# Patient Record
Sex: Male | Born: 1965 | Race: Black or African American | Hispanic: No | Marital: Single | Smoking: Former smoker
Health system: Southern US, Community
[De-identification: ages and names within clinical notes are randomized; demographics above are authoritative.]

## PROBLEM LIST (undated history)

## (undated) DIAGNOSIS — R011 Cardiac murmur, unspecified: Secondary | ICD-10-CM

## (undated) HISTORY — PX: HERNIA REPAIR: SHX51

## (undated) HISTORY — DX: Cardiac murmur, unspecified: R01.1

---

## 2008-08-16 ENCOUNTER — Emergency Department: Payer: Self-pay | Admitting: Emergency Medicine

## 2011-09-27 ENCOUNTER — Emergency Department: Payer: Self-pay | Admitting: *Deleted

## 2011-10-10 ENCOUNTER — Ambulatory Visit: Payer: Self-pay | Admitting: Unknown Physician Specialty

## 2013-03-04 IMAGING — CT CT MAXILLOFACIAL WITHOUT CONTRAST
2 series · 15 of 40 positions shown, 18 images · non-contrast
Comparison: none

REASON FOR EXAM: trauma
COMMENTS:

[Series 2: facial 3.0 h60f · axial · 0.36mm/px · z∈[-193,-37]mm · 12 of 62 slices shown, 15 images]
[im 5/62  brain]
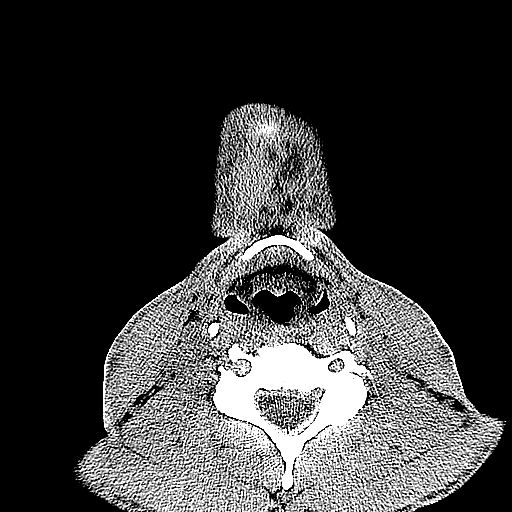
[im 5/62  bone]
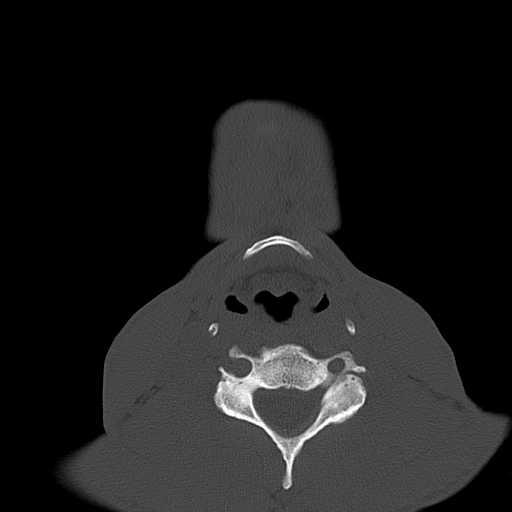
[im 9/62  bone]
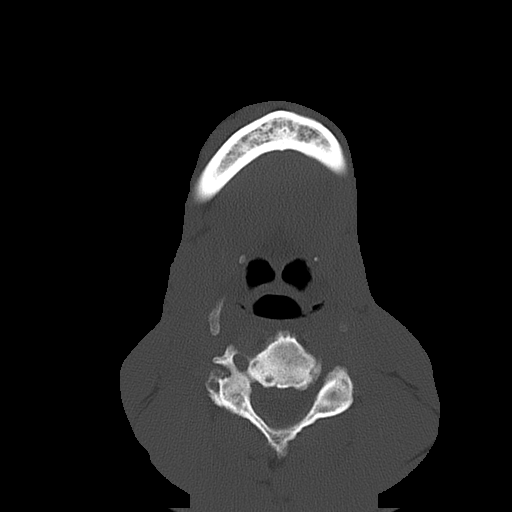
[im 13/62  bone]
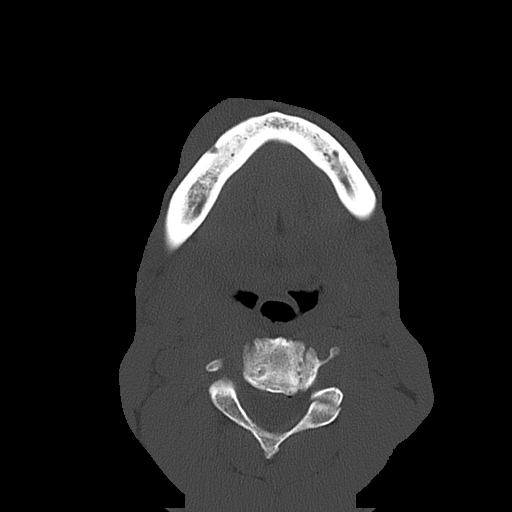
[im 17/62  bone]
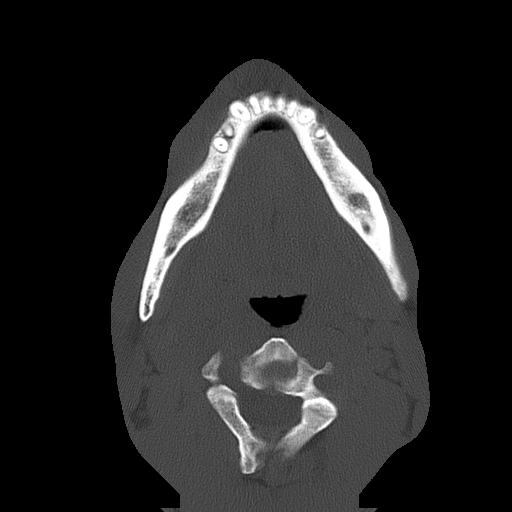
[im 26/62  brain]
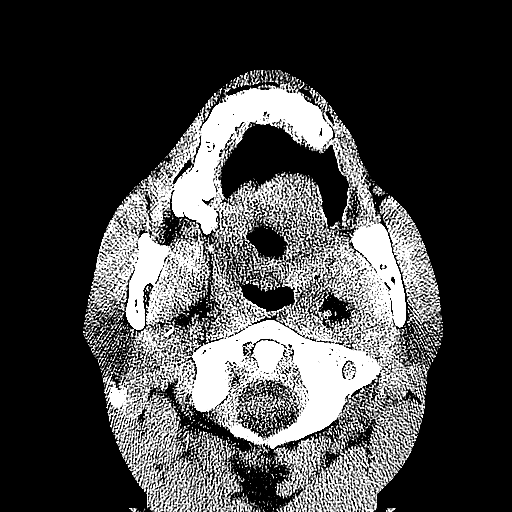
[im 26/62  bone]
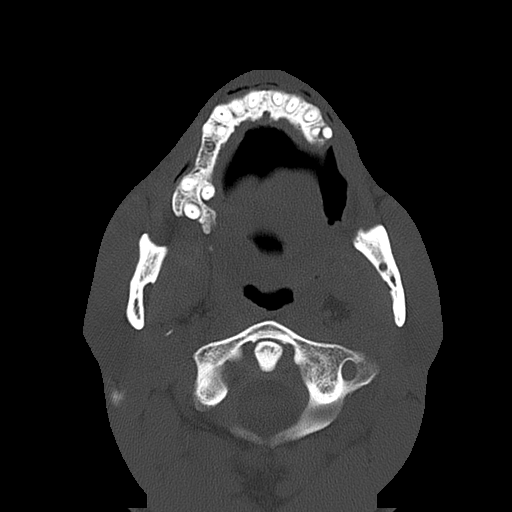
[im 30/62  bone]
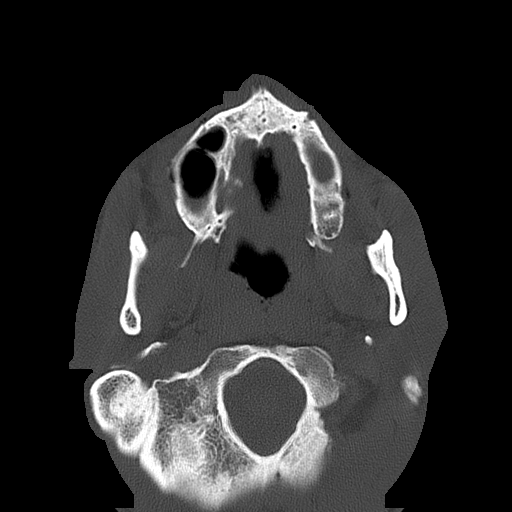
[im 34/62  bone]
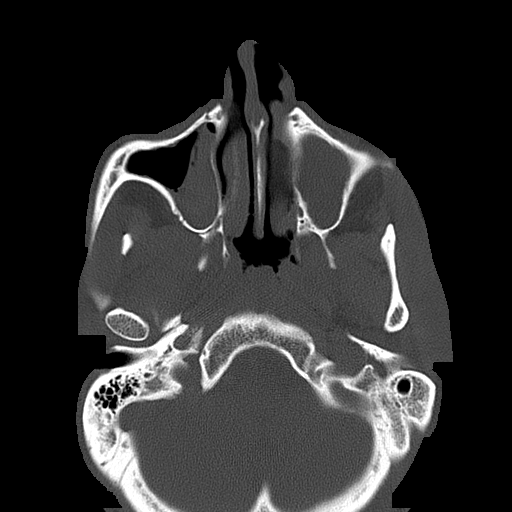
[im 38/62  bone]
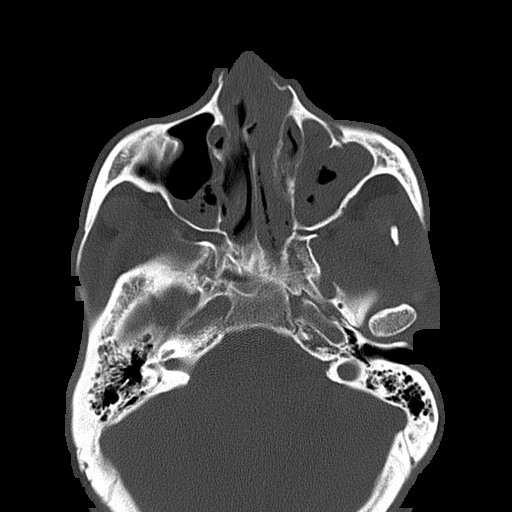
[im 45/62  brain]
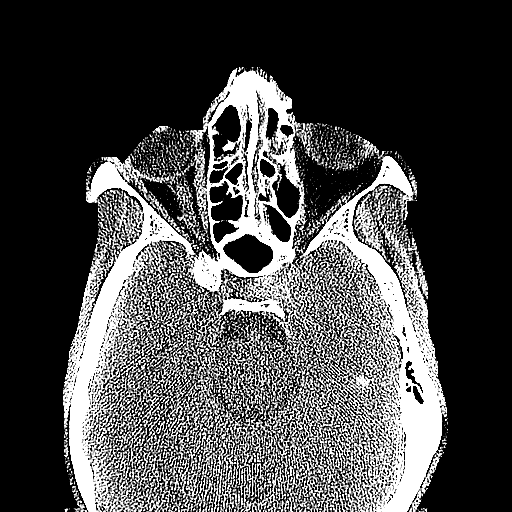
[im 45/62  bone]
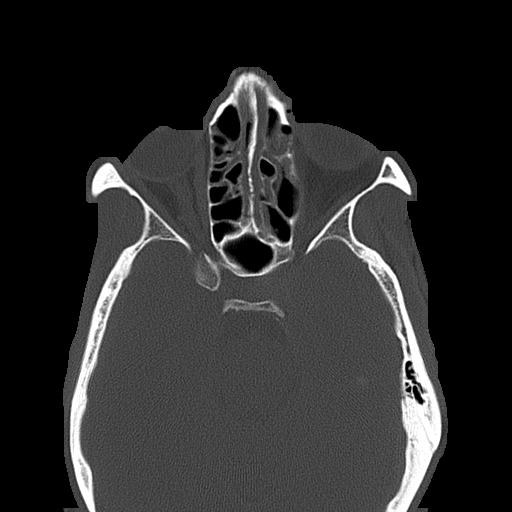
[im 49/62  bone]
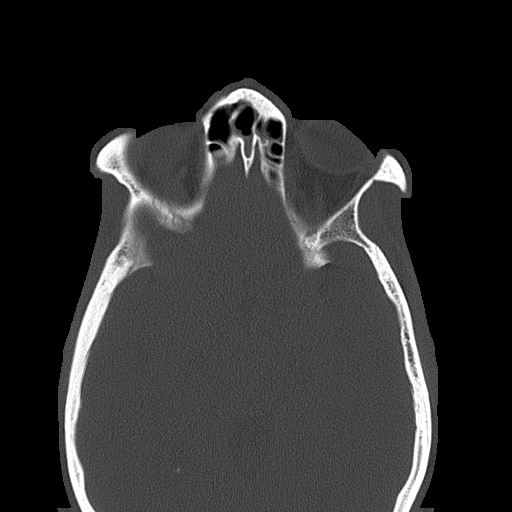
[im 53/62  bone]
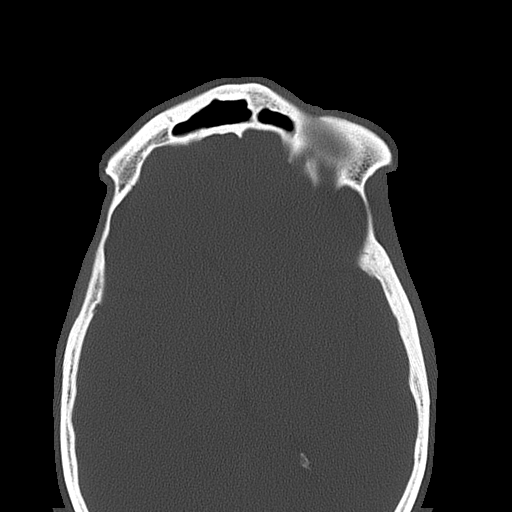
[im 57/62  bone]
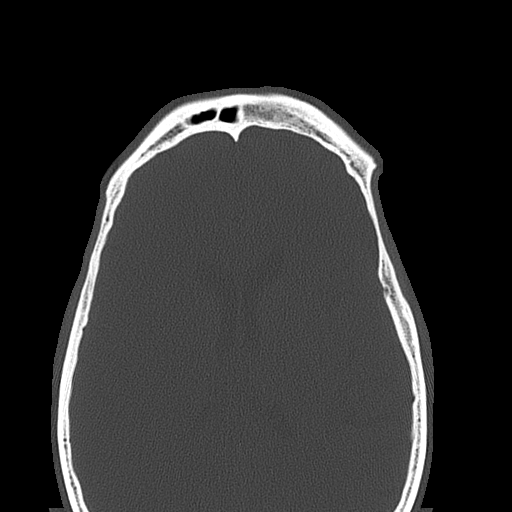

[Series 3: facial 3.0 spo · coronal · 0.38mm/px · 3 of 41 slices shown]
[im 14/41  bone]
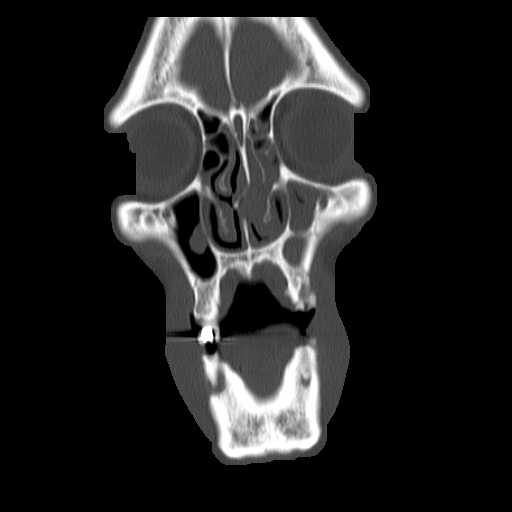
[im 18/41  bone]
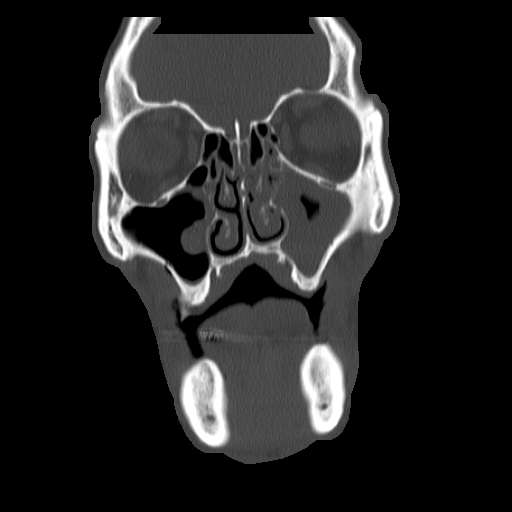
[im 23/41  bone]
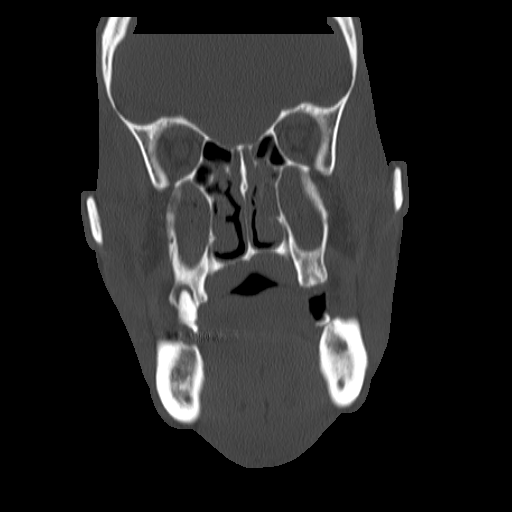

[15 of 40 positions shown; findings below may reference images not displayed]

PROCEDURE:     CT  - CT MAXILLOFACIAL AREA WO  - September 27, 2011  [DATE]

RESULT:     Multislice helical acquisition through the maxillofacial
structures is reconstructed in the coronal and axial planes at 3.0 mm slice
thickness. There is no previous exam for comparison.

Significant left periorbital and nasal soft tissue swelling is present.
Air-fluid level is seen in the right maxillary sinus with near complete
opacification of the left maxillary sinus and a small air-fluid level in the
right sphenoid sinus. The mastoid show normal aeration. The nasal septum
approximates midline without a definite fracture. Nasal bone fractures and
fracture through the nasal process of the frontal bone are noted. There is a
suggestion of a nondisplaced medial left orbital wall fracture in the region
of axial image 47 and coronal image 16 and coronal image 19. Nondisplaced
right maxillary sinus fracture is seen in the anterior and lateral walls.
Minimally displaced left lamina papyracea fracture is seen. Periodontal
disease changes are present. There is no evidence of mandibular fracture or
dislocation.
IMPRESSION: Right maxillary, left orbital and nasal bone fractures as described with
fracture of the left lamina papyracea and through the nasal process of the
frontal bones noted. Orbital structures are otherwise unremarkable. Sinus
opacification is present as described.

## 2013-03-04 IMAGING — CT CT HEAD WITHOUT CONTRAST
2 series · 16 of 30 positions shown, 20 images · non-contrast
Comparison: none

REASON FOR EXAM: trauma
COMMENTS:

[Series 2: without · axial · non-contrast · 0.44mm/px · z∈[-165,-30]mm · 13 of 33 slices shown, 17 images]
[im 3/33  brain]
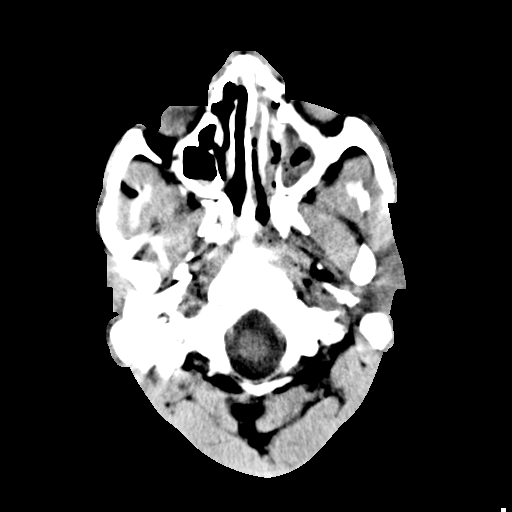
[im 3/33  bone]
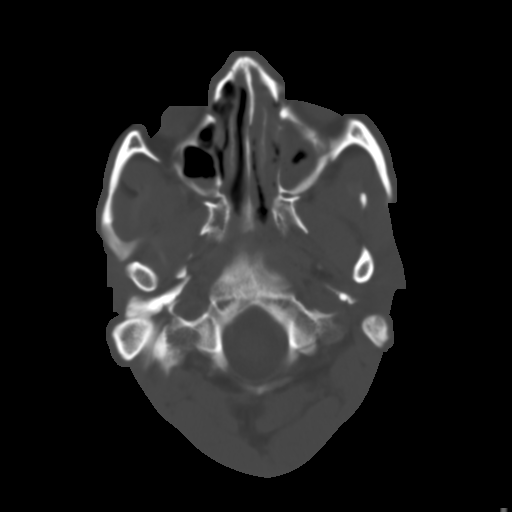
[im 5/33  brain]
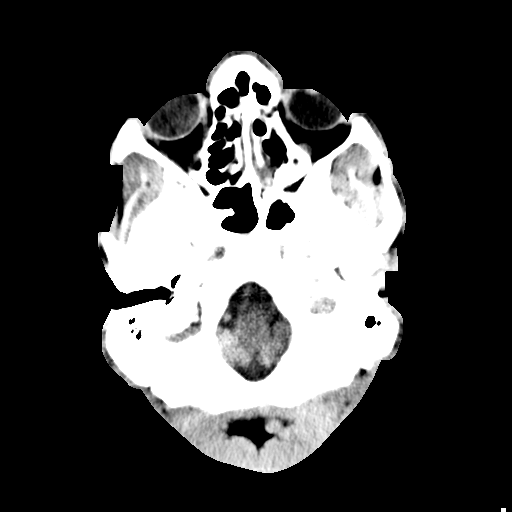
[im 7/33  brain]
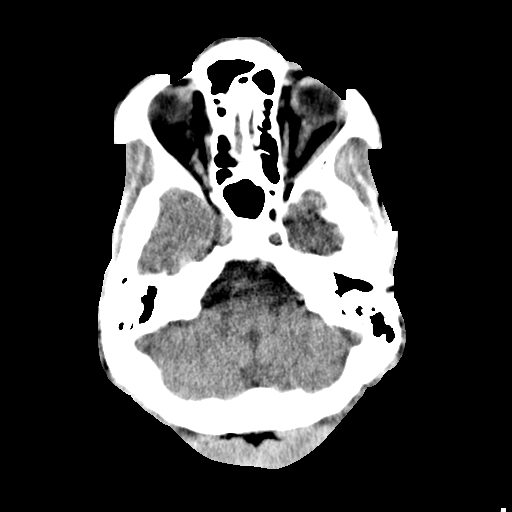
[im 10/33  brain]
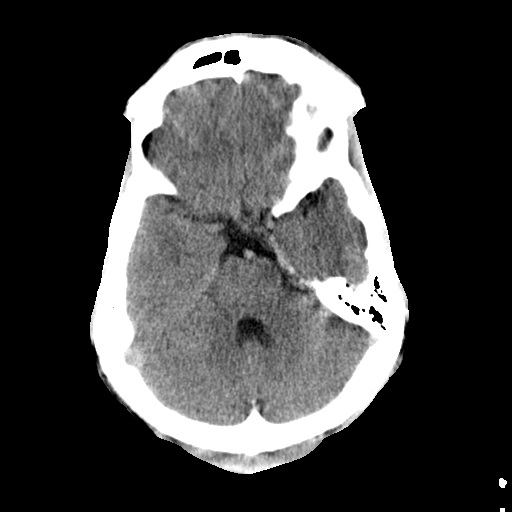
[im 12/33  brain]
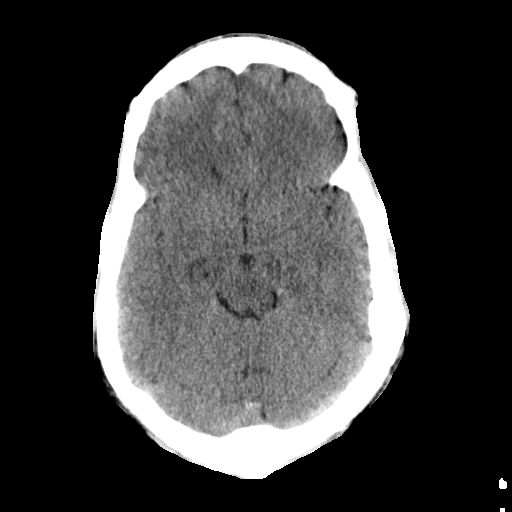
[im 12/33  bone]
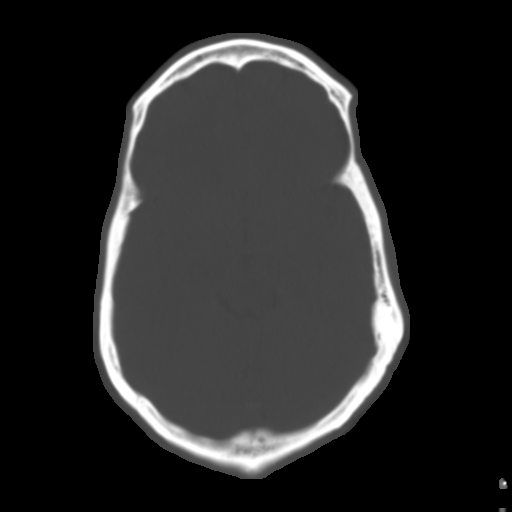
[im 14/33  brain]
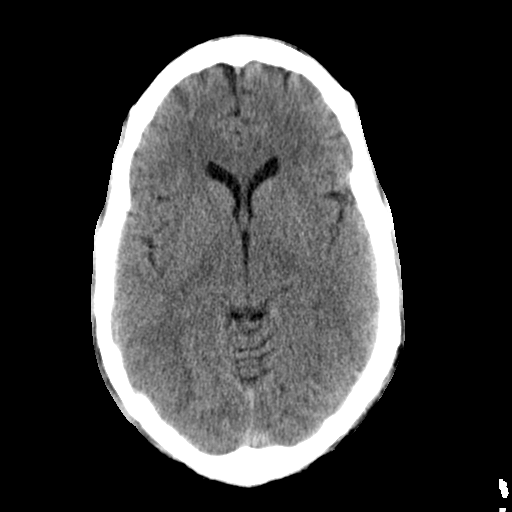
[im 17/33  brain]
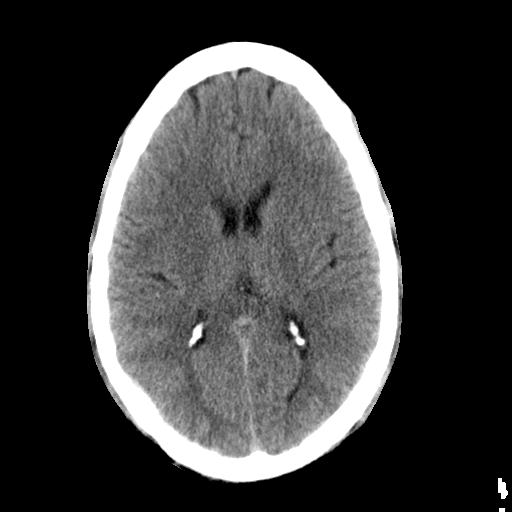
[im 19/33  brain]
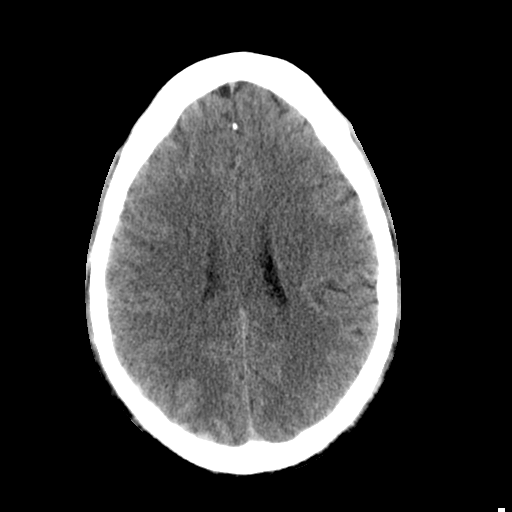
[im 21/33  brain]
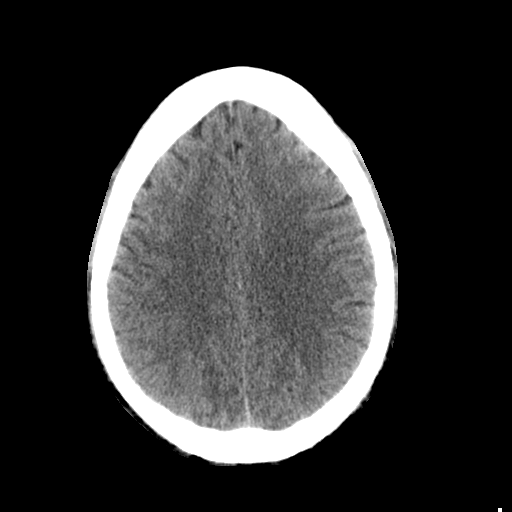
[im 21/33  bone]
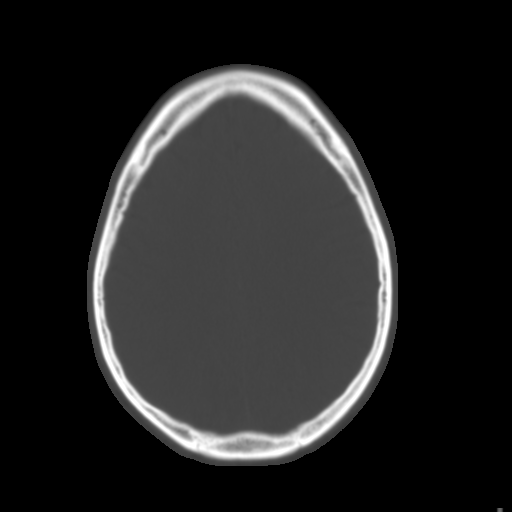
[im 23/33  brain]
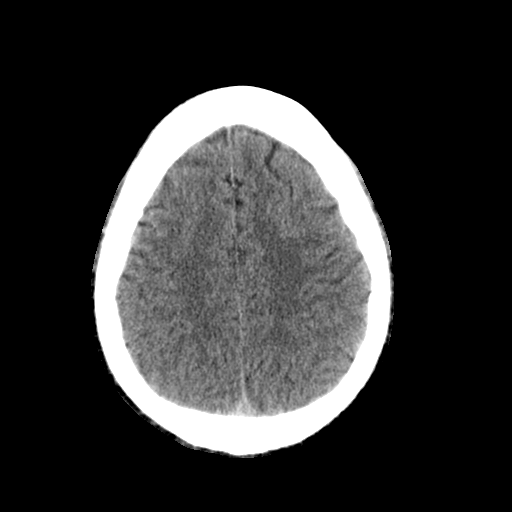
[im 26/33  brain]
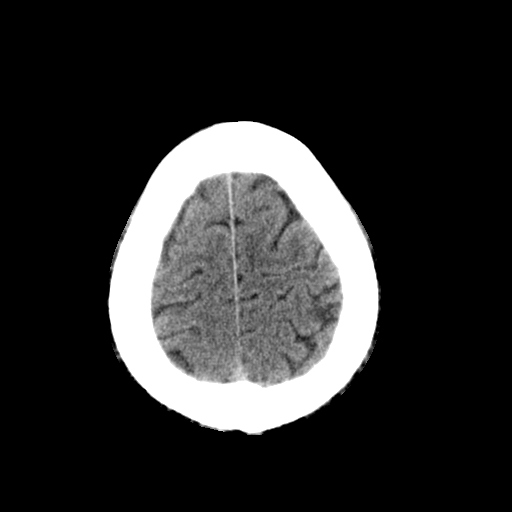
[im 28/33  brain]
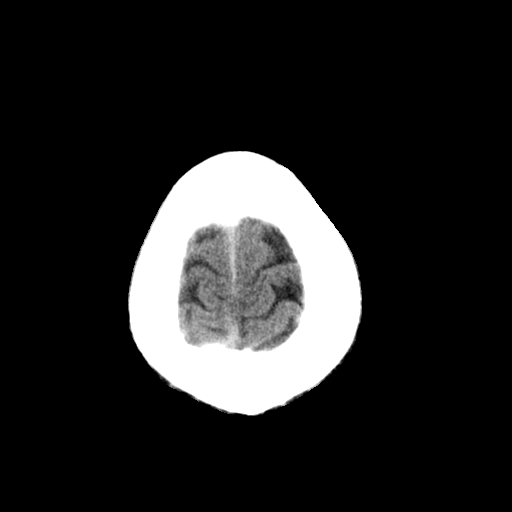
[im 30/33  brain]
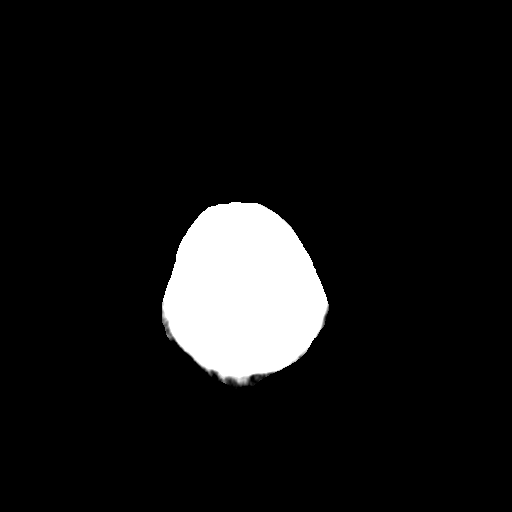
[im 30/33  bone]
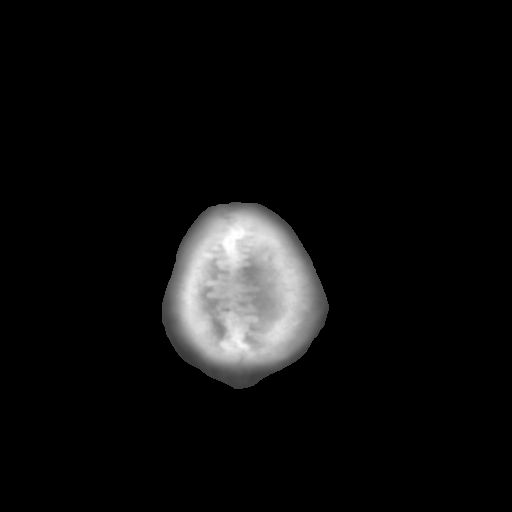

[Series 3: bone · axial · 0.44mm/px · z∈[-165,-120]mm · 3 of 33 slices shown]
[im 3/33  bone]
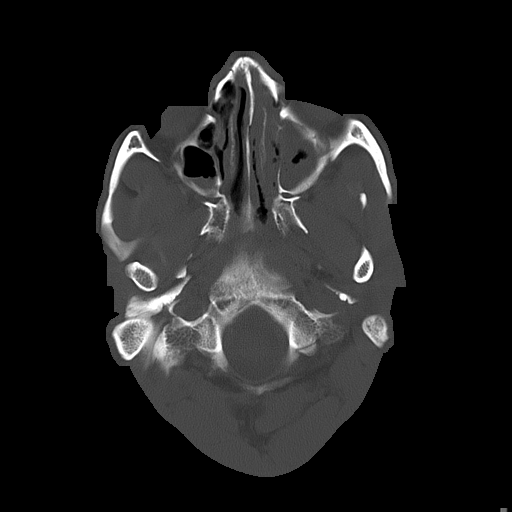
[im 7/33  bone]
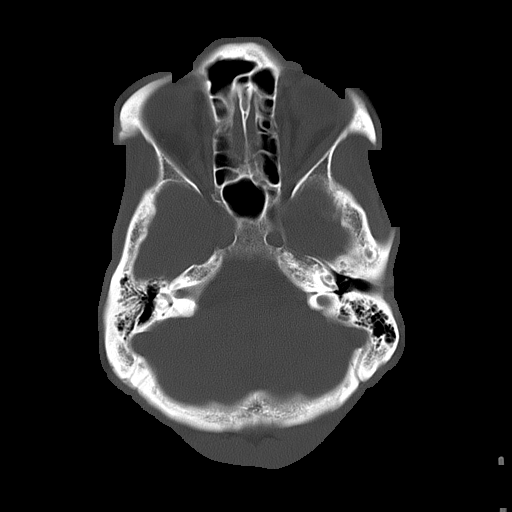
[im 12/33  bone]
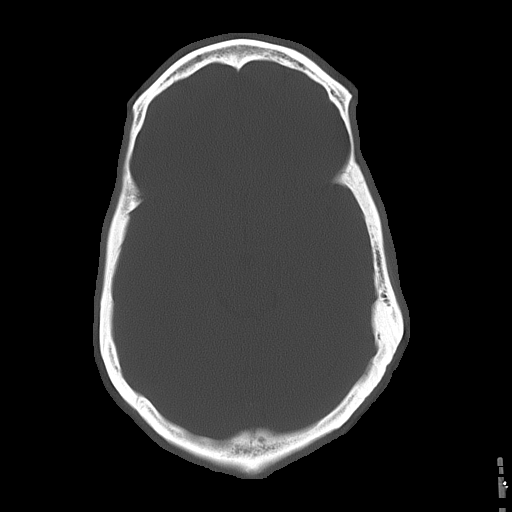

[16 of 30 positions shown; findings below may reference images not displayed]

PROCEDURE:     CT  - CT HEAD WITHOUT CONTRAST  - September 27, 2011  [DATE]

RESULT:     Emergent CT of the brain demonstrates periorbital soft tissue
swelling and perinasal soft tissue swelling with bilateral nasal bone
fractures evident. Fluid is present in the right sphenoid sinus and both
maxillary sinuses especially on the left. The calvarium is intact. The
ventricles and sulci appear to be normal. There is no intracranial
hemorrhage, mass effect or midline shift. No territorial infarct is present.
Please see the separate dictation of the maxillofacial structures and
cervical spine.
IMPRESSION: 1. No acute intracranial abnormality.
2. Contusions in the frontal and periorbital and nasal regions.
3. Nasal bone fractures.

## 2014-08-27 ENCOUNTER — Emergency Department: Payer: Self-pay | Admitting: Emergency Medicine

## 2016-02-02 IMAGING — CR DG CHEST 2V
1 series · 2 of 2 positions shown · non-contrast
Comparison: None.

CLINICAL DATA: Acute chest pain after in the BV. Shortness of
breath.

EXAM:
CHEST  2 VIEW

[Series 1: dxr chest pa (or ap) and lateral · 0.14mm/px · 2 of 2 slices shown]
[im 1/2]
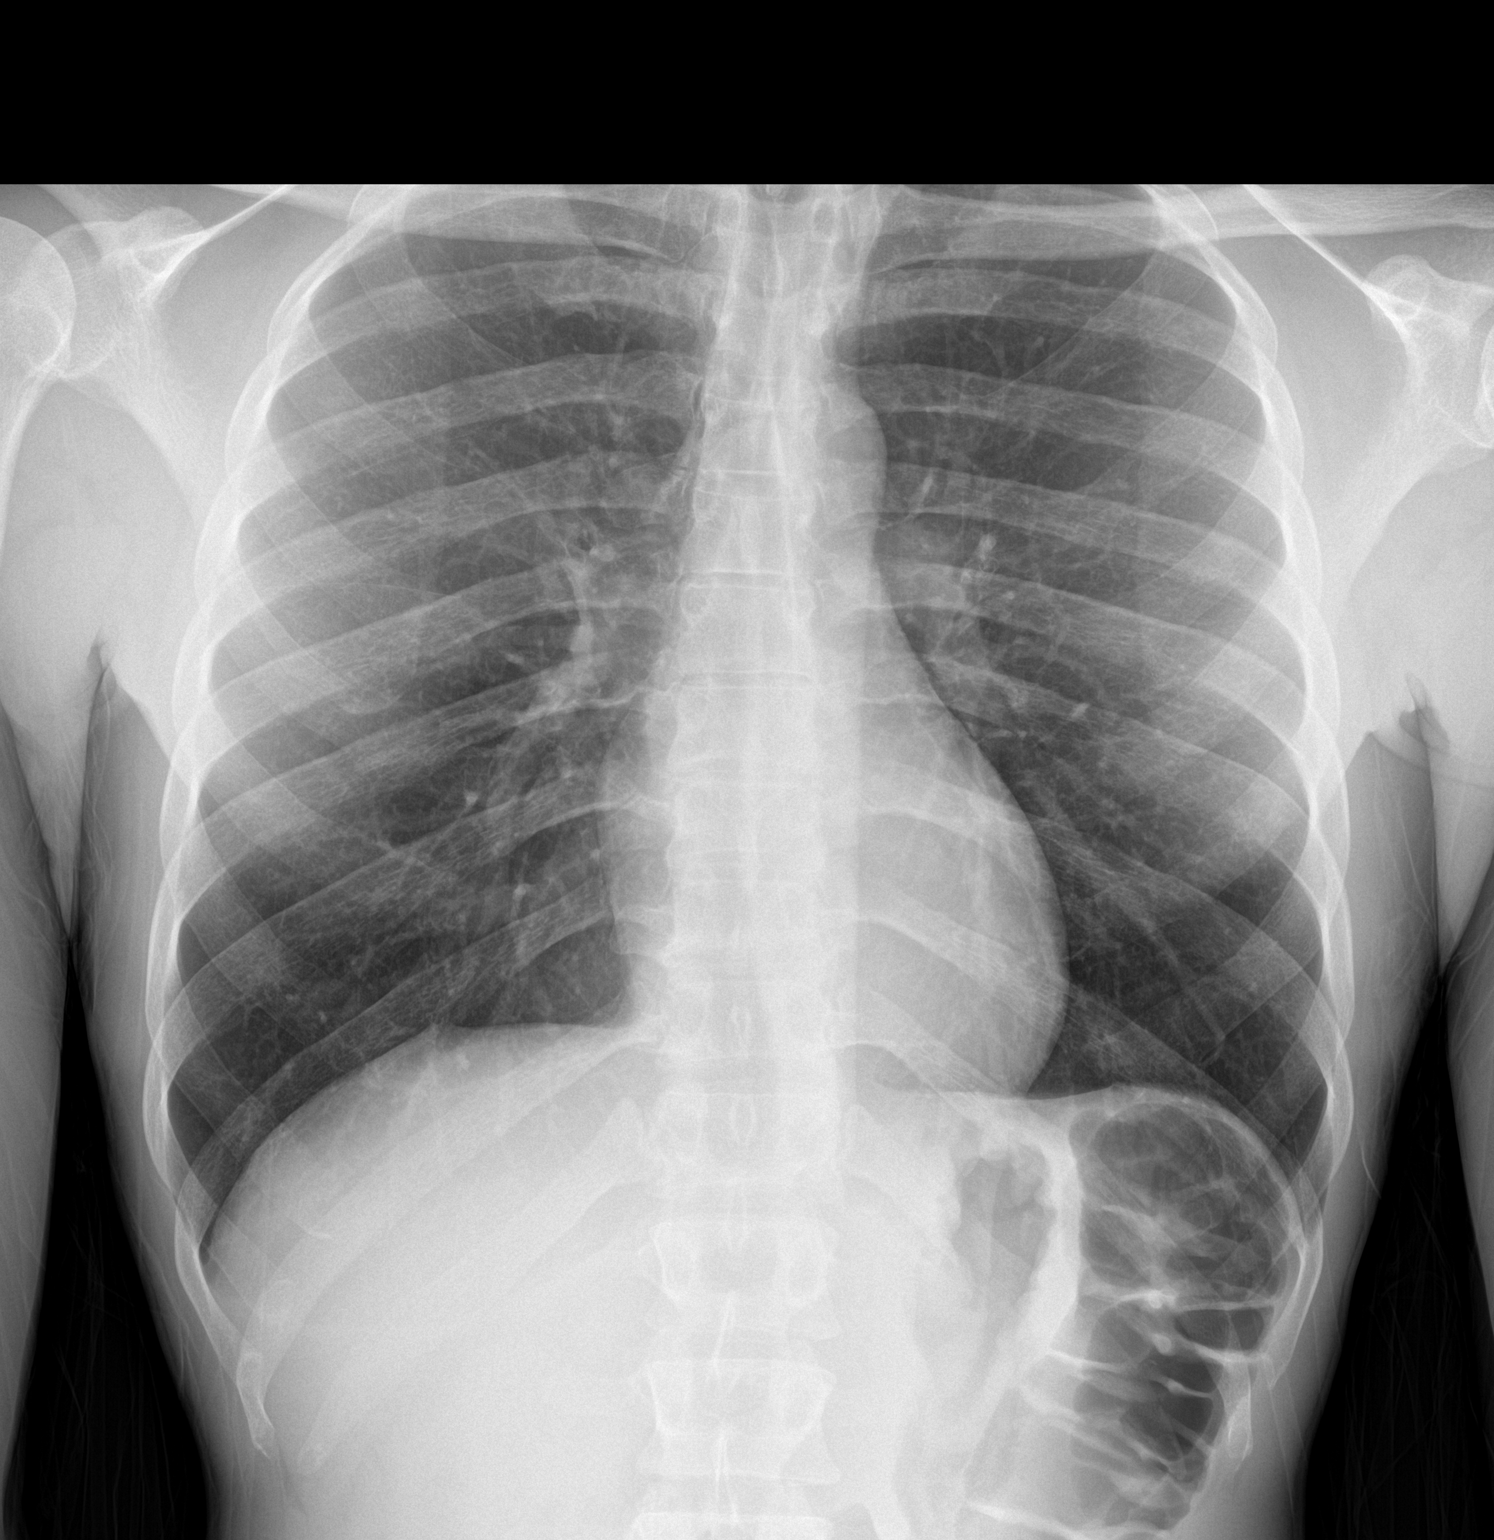
[im 2/2]
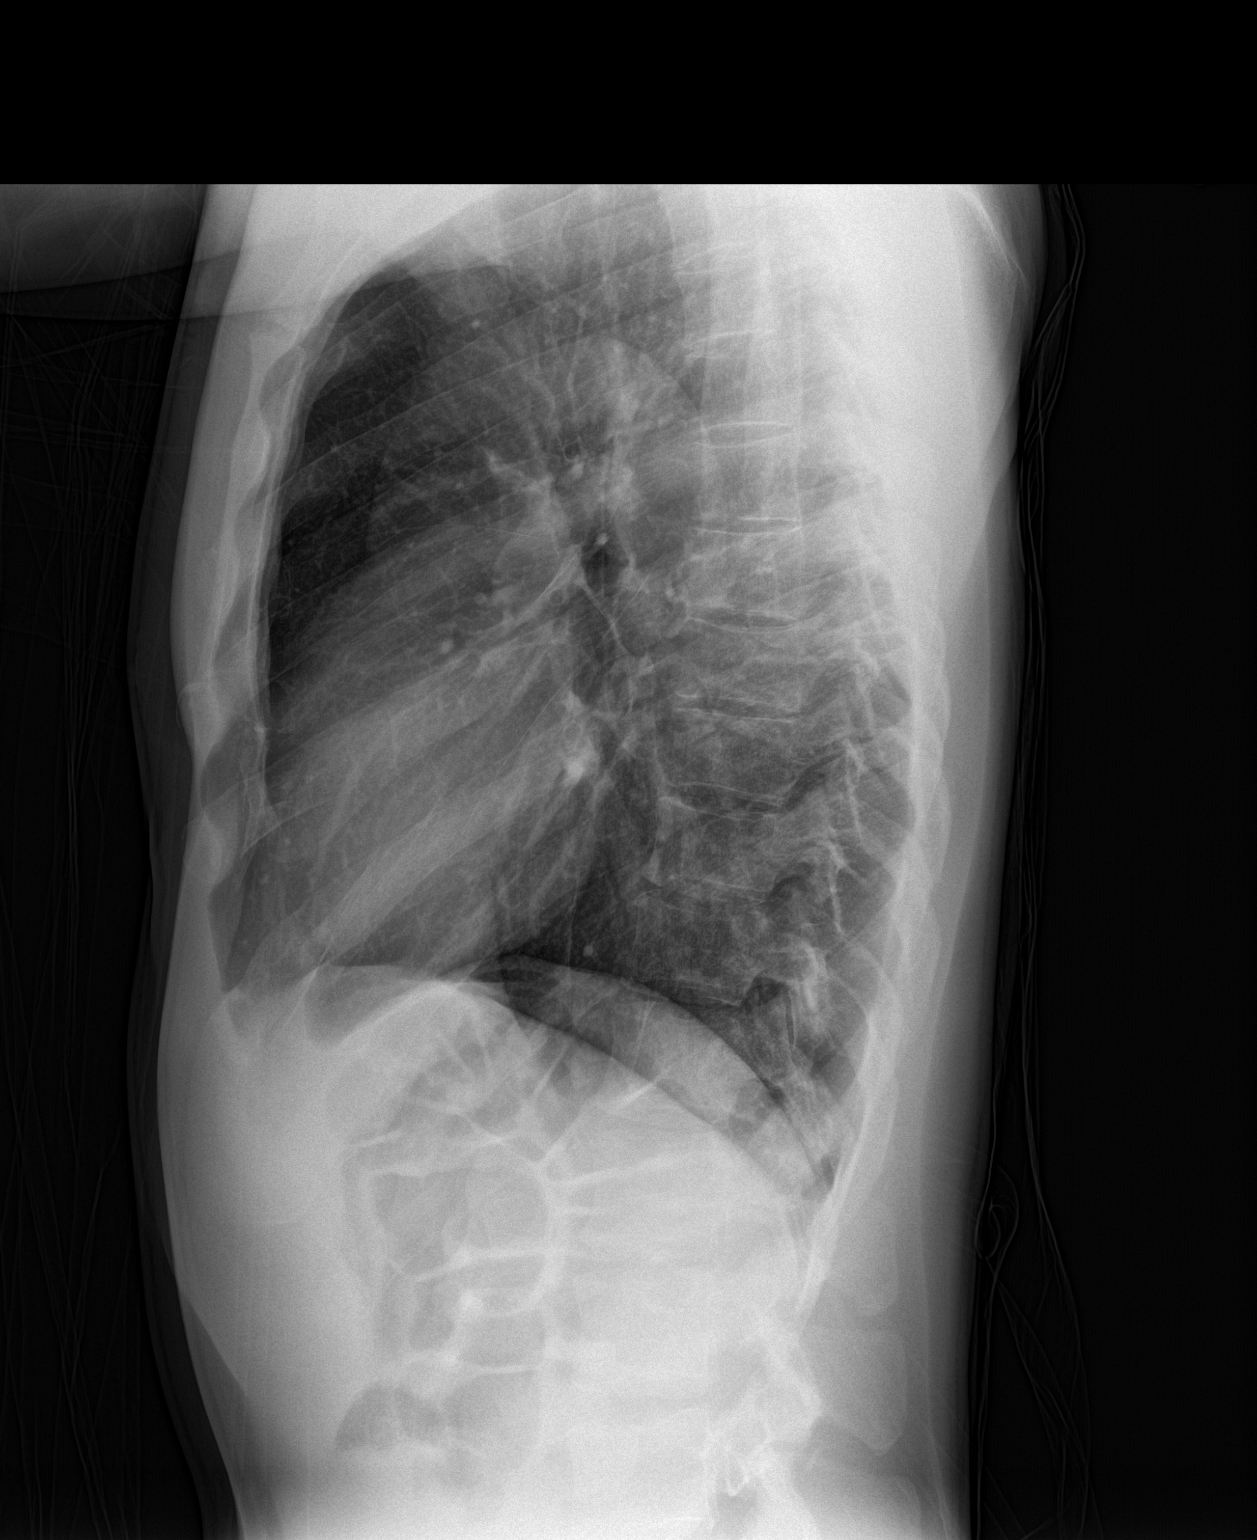

[2 of 2 positions shown; findings below may reference images not displayed]

FINDINGS: The heart size and mediastinal contours are within normal limits.
Both lungs are clear. The visualized skeletal structures are
unremarkable.
IMPRESSION: No active cardiopulmonary disease.

## 2022-03-05 ENCOUNTER — Ambulatory Visit: Payer: Medicaid Other | Admitting: Nurse Practitioner

## 2022-03-05 DIAGNOSIS — Z113 Encounter for screening for infections with a predominantly sexual mode of transmission: Secondary | ICD-10-CM

## 2022-03-05 LAB — HEPATITIS B SURFACE ANTIGEN: Hepatitis B Surface Ag: NONREACTIVE

## 2022-03-05 NOTE — Progress Notes (Signed)
Pt here for STD screening.  Condoms given.  Judy Goodenow M Mayfield Schoene, RN  

## 2022-03-05 NOTE — Progress Notes (Addendum)
[facility] Department STI clinic/screening visit  Subjective:  Norman Humphrey is a 56 y.o. male being seen today for an STI screening visit. The patient reports they do not have symptoms.    Patient has the following medical conditions:  There are no problems to display for this patient.    Chief Complaint  Patient presents with   SEXUALLY TRANSMITTED DISEASE    Screening    HPI  Patient reports to clinic with partner today for testing for Syphilis.  Patient states that his partner was treated in May for Syphilis and since treatment they have been sexually active.  Patient is unsure of RPR status.    Does the patient or their partner desires a pregnancy in the next year? No  Screening for MPX risk: Does the patient have an unexplained rash? No Is the patient MSM? No Does the patient endorse multiple sex partners or anonymous sex partners? No Did the patient have close or sexual contact with a person diagnosed with MPX? No Has the patient traveled outside the [place] where MPX is endemic? No Is there a high clinical suspicion for MPX-- evidenced by one of the following No  -Unlikely to be chickenpox  -Lymphadenopathy  -Rash that present in same phase of evolution on any given body part   See flowsheet for further details and programmatic requirements.    There is no immunization history on file for this patient.   The following portions of the patient's history were reviewed and updated as appropriate: allergies, current medications, past medical history, past social history, past surgical history and problem list.  Objective:  There were no vitals filed for this visit.  Physical Exam Constitutional:      Appearance: Normal appearance.  HENT:     Head: Normocephalic. No abrasion, masses or laceration. Hair is normal.     Mouth/Throat:     Mouth: No oral lesions.     Pharynx: No pharyngeal swelling, oropharyngeal exudate, posterior oropharyngeal erythema or  uvula swelling.     Tonsils: No tonsillar exudate or tonsillar abscesses.     Comments: Poor dentition  Eyes:     General: Lids are normal.        Right eye: No discharge.        Left eye: No discharge.     Conjunctiva/sclera: Conjunctivae normal.     Right eye: No exudate.    Left eye: No exudate. Abdominal:     General: Abdomen is flat.     Palpations: Abdomen is soft.     Tenderness: There is no abdominal tenderness. There is no rebound.  Genitourinary:    Pubic Area: No rash or pubic lice.      Penis: Normal and circumcised. No erythema or discharge.      Testes: Normal.        Right: Mass or tenderness not present.        Left: Mass or tenderness not present.     Rectum: Normal.     Comments: Discharge amount: small Color: clear Lymphadenopathy:     Cervical: No cervical adenopathy.     Right cervical: No superficial, deep or posterior cervical adenopathy.    Left cervical: No superficial, deep or posterior cervical adenopathy.     Upper Body:     Right upper body: No supraclavicular, axillary or epitrochlear adenopathy.     Left upper body: No supraclavicular, axillary or epitrochlear adenopathy.     Lower Body: No right inguinal adenopathy. Left  inguinal adenopathy present.  Skin:    Findings: No lesion or rash.  Neurological:     Mental Status: He is alert and oriented to person, place, and time.       Assessment and Plan:  Norman Humphrey is a 56 y.o. male presenting to the [facility] Department for STI screening  1. Screening for venereal disease -56 year old male in clinic today for STD screening. -Patient advised not to have sex until test results are received. Will discuss treatment options once test results are received.   -Patient does not have STI symptoms Patient declined screenings including urethra GC and gram stain.  Accepts bloodwork for HIV/RPR.  Patient meets criteria for HepB screening? Yes. Ordered? Yes Patient meets criteria for  HepC screening? Yes. Ordered? Yes Recommended condom use with all sex Discussed importance of condom use for STI prevent  Discussed time line for [facility] results and that patient will be called with positive results and encouraged patient to call if he had not heard in 2 weeks Recommended returning for continued or worsening symptoms.    - Syphilis Serology, [facility] - HIV/HCV [facility] - HBV Antigen/Antibody [facility]     Return if symptoms worsen or fail to improve.    Gregary Cromer, FNP

## 2022-03-12 ENCOUNTER — Telehealth: Payer: Self-pay

## 2022-03-12 NOTE — Telephone Encounter (Signed)
  Anti-HCV Antibody (CMIA) Reactive HCV RNA Detection (TMA) Detected HCV RNA Quantitation IU/mL (TMA) 2,751,700 HCV RNA Quantitation Log10 IU/mL (TMA) 6.450  Comment(s):  Clinical Interpretation: Indicates active infection. Patient should be referred to Hepatitis C-treating provider. Recommended Follow-Up Action: Hepatitis C RNA levels do not correlate directly with degree of disease activity. The quantitative range of this assay is 10 - 100,000,000 IU/mL (1.0 - 8.0 log IU/mL).

## 2022-03-13 ENCOUNTER — Telehealth: Payer: Self-pay

## 2022-03-13 NOTE — Telephone Encounter (Signed)
Phone call to pt at [phone].  Male answered phone and stated that pt was not there.  Left message requesting that Mr. Pretty call his doctor's office.  MyChart is inactive.

## 2022-03-18 ENCOUNTER — Ambulatory Visit: Payer: Self-pay | Admitting: Family Medicine

## 2022-03-18 DIAGNOSIS — Z202 Contact with and (suspected) exposure to infections with a predominantly sexual mode of transmission: Secondary | ICD-10-CM

## 2022-03-18 DIAGNOSIS — A539 Syphilis, unspecified: Secondary | ICD-10-CM

## 2022-03-18 DIAGNOSIS — B182 Chronic viral hepatitis C: Secondary | ICD-10-CM | POA: Insufficient documentation

## 2022-03-18 DIAGNOSIS — Z113 Encounter for screening for infections with a predominantly sexual mode of transmission: Secondary | ICD-10-CM

## 2022-03-18 LAB — HM HEPATITIS C SCREENING LAB: HM Hepatitis Screen: POSITIVE

## 2022-03-18 MED ORDER — DOXYCYCLINE HYCLATE 100 MG PO TABS: 100.0000 mg | ORAL_TABLET | Freq: Two times a day (BID) | ORAL | 0 refills | Status: AC

## 2022-03-18 MED ORDER — GENTAMICIN SULFATE 40 MG/ML IJ SOLN
240.0000 mg | Freq: Once | INTRAMUSCULAR | Status: AC
  Administered 2022-03-18: 240 mg via INTRAMUSCULAR

## 2022-03-18 MED ORDER — AZITHROMYCIN 500 MG PO TABS
2000.0000 mg | ORAL_TABLET | Freq: Once | ORAL | Status: AC
  Administered 2022-03-18: 2000 mg via ORAL

## 2022-03-18 MED ORDER — DOXYCYCLINE HYCLATE 100 MG PO TABS: 100.0000 mg | ORAL_TABLET | Freq: Two times a day (BID) | ORAL | 0 refills | Status: DC

## 2022-03-18 NOTE — Progress Notes (Signed)
Pt was in STI clinic today. Pt had recent bloodwork that showed he was Hep C + 03/05/22. This nurse went to speak with him regarding those results.Provided education and counseled pt on Hep C. Advised pt would be sending his name to Hep C Paramedic as well.Pt did not have any further questions at this time.

## 2022-03-18 NOTE — Progress Notes (Signed)
Patient seen for STD screening and tx for exposure to gonorrhea. Instructed patient to have no sex until all tx is completed.

## 2022-03-19 NOTE — Telephone Encounter (Signed)
Pt to clinic on 03/18/22. Doxycycline dispensed for syphilis tx and pt also treated as contact to GC.  Syphilis blood work repeated on 03/18/22, results pending.

## 2022-03-27 NOTE — Telephone Encounter (Signed)
03/18/22 RPR Non-Reactive

## 2023-08-10 ENCOUNTER — Ambulatory Visit (HOSPITAL_BASED_OUTPATIENT_CLINIC_OR_DEPARTMENT_OTHER): Payer: Medicaid Other | Admitting: Family Medicine

## 2023-09-09 ENCOUNTER — Encounter (HOSPITAL_BASED_OUTPATIENT_CLINIC_OR_DEPARTMENT_OTHER): Payer: Self-pay | Admitting: Family Medicine

## 2023-09-09 ENCOUNTER — Ambulatory Visit (INDEPENDENT_AMBULATORY_CARE_PROVIDER_SITE_OTHER): Payer: No Typology Code available for payment source | Admitting: Family Medicine

## 2023-09-09 VITALS — BP 144/87 | HR 61 | Ht 70.0 in | Wt 166.0 lb

## 2023-09-09 DIAGNOSIS — J019 Acute sinusitis, unspecified: Secondary | ICD-10-CM

## 2023-09-09 DIAGNOSIS — B9689 Other specified bacterial agents as the cause of diseases classified elsewhere: Secondary | ICD-10-CM | POA: Diagnosis not present

## 2023-09-09 DIAGNOSIS — Z7689 Persons encountering health services in other specified circumstances: Secondary | ICD-10-CM

## 2023-09-09 MED ORDER — DOXYCYCLINE HYCLATE 100 MG PO TABS: 100.0000 mg | ORAL_TABLET | Freq: Two times a day (BID) | ORAL | 0 refills | Status: AC

## 2023-09-09 NOTE — Progress Notes (Signed)
New Patient Office Visit  Subjective   Patient ID: Norman Humphrey, male    DOB: 1966-06-08  Age: 57 y.o. MRN: [id number]  CC:  Chief Complaint  Patient presents with   Cough    Ongoing for about a month, has been trying dayquil & mucinex   Nasal Congestion    HPI Norman Humphrey is a 57 year old male who presents to establish with Primary Care & Sports Medicine at [facility]. He does not have concerns today. His significant other has concerns about a productive cough and nasal congestion that he has had for about a month. Not improving. Denies sinus pressure/pain, ear pain, headaches, fever/chills, shob, chest pain.   Reviewed the past medical history, family history, social history, surgical history, medications and allergies today- updates made as indicated. No former PCP  Outpatient Encounter Medications as of 09/09/2023  Medication Sig   doxycycline (VIBRA-TABS) 100 MG tablet Take 1 tablet (100 mg total) by mouth 2 (two) times daily for 7 days.   No facility-administered encounter medications on file as of 09/09/2023.   Past Medical History:  Diagnosis Date   Heart murmur     Past Surgical History:  Procedure Laterality Date   HERNIA REPAIR     History reviewed. No pertinent family history.  Review of Systems  Constitutional:  Negative for chills, fever and malaise/fatigue.  HENT:  Positive for congestion. Negative for ear pain and sore throat.   Respiratory:  Positive for cough, sputum production and wheezing. Negative for shortness of breath.   Cardiovascular:  Negative for chest pain, palpitations and leg swelling.  Gastrointestinal:  Negative for abdominal pain, nausea and vomiting.  Neurological:  Negative for dizziness and headaches.    Objective   BP (!) 144/87   Pulse 61   Ht 5\' 10"  (1.778 m)   Wt 166 lb (75.3 kg)   SpO2 98%   BMI 23.82 kg/m   Physical Exam Vitals reviewed.  Constitutional:      Appearance: Normal appearance.   HENT:     Head: Normocephalic.     Right Ear: Hearing, tympanic membrane, ear canal and external ear normal.     Left Ear: Hearing, tympanic membrane, ear canal and external ear normal.     Nose: Congestion present.     Right Turbinates: Swollen.     Left Turbinates: Swollen.     Right Sinus: Maxillary sinus tenderness present. No frontal sinus tenderness.     Left Sinus: Maxillary sinus tenderness present. No frontal sinus tenderness.     Mouth/Throat:     Lips: Pink.     Mouth: Mucous membranes are moist.     Pharynx: Oropharynx is clear. Uvula midline.     Tonsils: No tonsillar exudate or tonsillar abscesses.  Cardiovascular:     Rate and Rhythm: Normal rate and regular rhythm.     Pulses: Normal pulses.     Heart sounds: Normal heart sounds.  Pulmonary:     Effort: Pulmonary effort is normal.     Breath sounds: Normal breath sounds.  Neurological:     Mental Status: He is alert.     Assessment & Plan:   1. Acute bacterial sinusitis (Primary) TMs intact with no erythema, bulging, or fluid present. Denies headache, sinus pressure/pain, and ear pain. Tenderness present to her maxillary sinuses. Cardiovascular exam with heart regular rate and rhythm. Normal heart sounds, no murmurs present. No lower extremity edema present. Lungs clear to auscultation bilaterally- no wheezing present.  Reasonable to treat with PO antibiotic. Advised patient to return if symptoms continue to persist or worsen. - doxycycline (VIBRA-TABS) 100 MG tablet; Take 1 tablet (100 mg total) by mouth 2 (two) times daily for 7 days.  Dispense: 14 tablet; Refill: 0  2. Encounter to establish care Patient is a 57 year old male who presents today to establish care with primary care at [facility]. Reviewed the past medical history, family history, social history, surgical history, medications and allergies today- updates made as indicated. He does not present with any concerns, but his significant other is  concerned about ongoing nasal congestion and cough he has been having for a few weeks.    No follow-ups on file.   Alyson Reedy, FNP

## 2023-09-09 NOTE — Patient Instructions (Signed)
Vitamin Regimen:  Vitamin C 500mg  twice daily  Vitamin D 5000 units once daily  Zinc 50-75mg  once daily   Over the counter Medications:  Aspirin 81mg  per day * unless allergic or contraindicated*  Use Tylenol (acetaminophen) for fever *unless allergic or contraindicated*    Non-Medication Therapy:  Drink plenty of fluids, warm if possible.   A teaspoon of honey may help ease coughing symptoms.   Cough drops or hard candy for coughing.   Over the Counter Medication Therapy:  Use a cough expectorant such as guaifenesin (Mucinex) if recommended by your doctor for a wet, congested cough. If you have high blood pressure, please ask your doctor first before using this.   Use a cough suppressant such as dextromethorphan (Robitussin/Delsym) for a dry cough. If you have high blood pressure, please ask your doctor first before using this.   If you have high blood pressure, medication such as Coricidin HBP is safe to take for your cough and will not increase your blood pressure.

## 2023-12-22 ENCOUNTER — Encounter: Payer: Self-pay | Admitting: *Deleted

## 2024-01-05 ENCOUNTER — Other Ambulatory Visit: Payer: Self-pay | Admitting: *Deleted

## 2024-01-05 ENCOUNTER — Telehealth: Payer: Self-pay | Admitting: *Deleted

## 2024-01-05 DIAGNOSIS — Z122 Encounter for screening for malignant neoplasm of respiratory organs: Secondary | ICD-10-CM

## 2024-01-05 DIAGNOSIS — Z87891 Personal history of nicotine dependence: Secondary | ICD-10-CM

## 2024-01-05 DIAGNOSIS — F17210 Nicotine dependence, cigarettes, uncomplicated: Secondary | ICD-10-CM

## 2024-01-05 NOTE — Telephone Encounter (Signed)
 Lung Cancer Screening Narrative/Criteria Questionnaire (Cigarette Smokers Only- No Cigars/Pipes/vapes)   Norman Humphrey   SDMV:01/25/24 9:00- [name]                                           1965-11-28              LDCT: 01/26/24 9:30- [facility]    58 y.o.   Phone: [phone]  Lung Screening Narrative (confirm age 2-77 yrs Medicare / 50-80 yrs Private pay insurance)   Insurance information:[employer] MCD   Referring Provider:Stephenson   This screening involves an initial phone call with a team member from our program. It is called a shared decision making visit. The initial meeting is required by insurance and Medicare to make sure you understand the program. This appointment takes about 15-20 minutes to complete. The CT scan will completed at a separate date/time. This scan takes about 5-10 minutes to complete and you may eat and drink before and after the scan.  Criteria questions for Lung Cancer Screening:   Are you a current or former smoker? Former Age began smoking: 10   If you are a former smoker, what year did you quit smoking? 2020 (within 15 yrs)   To calculate your smoking history, I need an accurate estimate of how many packs of cigarettes you smoked per day and for how many years. (Not just the number of PPD you are now smoking)   Years smoking 43 x Packs per day 1 = Pack years 43   (at least 20 pack yrs)   (Make sure they understand that we need to know how much they have smoked in the past, not just the number of PPD they are smoking now)  Do you have a personal history of cancer?  No    Do you have a family history of cancer? Yes  (cancer type and and relative) GF (Colon) Uncle (Colon)  Are you coughing up blood?  No  Have you had unexplained weight loss of 15 lbs or more in the last 6 months? No  It looks like you meet all criteria.     Additional information: N/A

## 2024-01-25 ENCOUNTER — Ambulatory Visit: Admitting: Acute Care

## 2024-01-25 DIAGNOSIS — Z87891 Personal history of nicotine dependence: Secondary | ICD-10-CM | POA: Diagnosis not present

## 2024-01-25 NOTE — Progress Notes (Addendum)
 Virtual Visit via Telephone Note  I connected with Jolee Naval on 01/25/24 at  9:00 AM EDT by telephone and verified that I am speaking with the correct person using two identifiers.  Location: Patient: Norman Humphrey Provider: Alyse Bach, RN   I discussed the limitations, risks, security and privacy concerns of performing an evaluation and management service by telephone and the availability of in person appointments. I also discussed with the patient that there may be a patient responsible charge related to this service. The patient expressed understanding and agreed to proceed.   Shared Decision Making Visit Lung Cancer Screening Program [phone])   Eligibility: Age 58 y.o. Pack Years Smoking History Calculation 42 (# packs/per year x # years smoked) Recent History of coughing up blood  no Unexplained weight loss? no ( >Than 15 pounds within the last 6 months ) Prior History Lung / other cancer no (Diagnosis within the last 5 years already requiring surveillance chest CT Scans). Smoking Status Former Smoker Former Smokers: Years since quit: 5 years  Quit Date: 2019  Visit Components: Discussion included one or more decision making aids. yes Discussion included risk/benefits of screening. yes Discussion included potential follow up diagnostic testing for abnormal scans. yes Discussion included meaning and risk of over diagnosis. yes Discussion included meaning and risk of False Positives. yes Discussion included meaning of total radiation exposure. yes  Counseling Included: Importance of adherence to annual lung cancer LDCT screening. yes Impact of comorbidities on ability to participate in the program. yes Ability and willingness to under diagnostic treatment. yes  Smoking Cessation Counseling: Current Smokers:  Discussed importance of smoking cessation. yes Information about tobacco cessation classes and interventions provided to patient. yes Patient provided  with "ticket" for LDCT Scan. no Symptomatic Patient. no  Counseling(Intermediate counseling: > three minutes) 99406 Diagnosis Code: Tobacco Use Z72.0 Asymptomatic Patient yes  Counseling (Intermediate counseling: > three minutes counseling) B2841 Former Smokers:  Discussed the importance of maintaining cigarette abstinence. yes Diagnosis Code: Personal History of Nicotine Dependence. L24.401 Information about tobacco cessation classes and interventions provided to patient. Yes Patient provided with "ticket" for LDCT Scan. no Written Order for Lung Cancer Screening with LDCT placed in Epic. Yes (CT Chest Lung Cancer Screening Low Dose W/O CM) [id number] Z12.2-Screening of respiratory organs Z87.891-Personal history of nicotine dependence   Alyse Bach, RN  I agree with the documentation of the Shared Decision Making visit,  smoking cessation counseling if appropriate, and verification or eligibility for lung cancer screening as documented by the RN Nurse Navigator.   Raejean Bullock, MSN, AGACNP-BC [facility] See Amion for personal pager PCCM on call pager [phone]

## 2024-01-25 NOTE — Patient Instructions (Signed)

## 2024-01-26 ENCOUNTER — Ambulatory Visit
Admission: RE | Admit: 2024-01-26 | Discharge: 2024-01-26 | Disposition: A | Discharge status: Home / Self Care | Source: Ambulatory Visit | Attending: Acute Care | Admitting: Acute Care

## 2024-01-26 DIAGNOSIS — F17210 Nicotine dependence, cigarettes, uncomplicated: Secondary | ICD-10-CM | POA: Insufficient documentation

## 2024-01-26 DIAGNOSIS — Z87891 Personal history of nicotine dependence: Secondary | ICD-10-CM | POA: Insufficient documentation

## 2024-01-26 DIAGNOSIS — Z122 Encounter for screening for malignant neoplasm of respiratory organs: Secondary | ICD-10-CM | POA: Insufficient documentation

## 2024-02-08 ENCOUNTER — Ambulatory Visit (INDEPENDENT_AMBULATORY_CARE_PROVIDER_SITE_OTHER): Payer: Self-pay | Admitting: Podiatry

## 2024-02-08 ENCOUNTER — Encounter: Payer: Self-pay | Admitting: Podiatry

## 2024-02-08 DIAGNOSIS — B351 Tinea unguium: Secondary | ICD-10-CM

## 2024-02-08 DIAGNOSIS — M79672 Pain in left foot: Secondary | ICD-10-CM

## 2024-02-08 DIAGNOSIS — L600 Ingrowing nail: Secondary | ICD-10-CM | POA: Diagnosis not present

## 2024-02-08 NOTE — Progress Notes (Signed)
 Patient presents for follow-up Lamisil.  For started taking it 6 months ago.  Has been doing well with much less soreness around the toes with walking and wearing   Physical exam:  General appearance: Pleasant, and in no acute distress. AOx3.  Vascular: Pedal pulses: DP palpable bilaterally, PT palpable bilaterally.  No edema lower legs bilaterally. Capillary fill time immediate.  Neurological: No paresthesias or burning noted.  Dermatologic:   Nails 1 through 5 toes bilaterally really 50 to 60% cleared of mycotic changes.  Much improved nail appearance.  Less redness along nail fold skin normal temperature bilaterally.  Skin normal color, tone, and texture bilaterally.  Some soreness with palpation of the great toenail bilaterally  Musculoskeletal:    Radiographs: None  Diagnosis: 1.  Onychomycosis 1 through 5 bilaterally  2.  INgrown nails bilaterally feet 3.  Pain in feet bilaterally   Plan: -Established office visit level 2 for evaluation and management -Has responded well to Lamisil discussed with him proper shoe wear and also discussed with him wearing synthetic moisture control.  Discussed other preventative things he can do to prevent   Return as needed

## 2024-02-19 ENCOUNTER — Other Ambulatory Visit: Payer: Self-pay

## 2024-02-19 DIAGNOSIS — Z122 Encounter for screening for malignant neoplasm of respiratory organs: Secondary | ICD-10-CM

## 2024-02-19 DIAGNOSIS — Z87891 Personal history of nicotine dependence: Secondary | ICD-10-CM

## 2024-09-09 ENCOUNTER — Encounter: Admission: RE | Payer: Self-pay | Source: Home / Self Care

## 2024-09-09 ENCOUNTER — Ambulatory Visit
Admission: RE | Admit: 2024-09-09 | Discharge: 2024-09-09 | Disposition: A | Discharge status: Home / Self Care | Attending: Gastroenterology | Admitting: Gastroenterology

## 2024-09-09 ENCOUNTER — Ambulatory Visit: Admitting: Anesthesiology

## 2024-09-09 ENCOUNTER — Encounter: Payer: Self-pay | Admitting: Gastroenterology

## 2024-09-09 DIAGNOSIS — Z1211 Encounter for screening for malignant neoplasm of colon: Secondary | ICD-10-CM | POA: Insufficient documentation

## 2024-09-09 DIAGNOSIS — D122 Benign neoplasm of ascending colon: Secondary | ICD-10-CM | POA: Insufficient documentation

## 2024-09-09 DIAGNOSIS — Z8619 Personal history of other infectious and parasitic diseases: Secondary | ICD-10-CM | POA: Diagnosis not present

## 2024-09-09 DIAGNOSIS — Z87891 Personal history of nicotine dependence: Secondary | ICD-10-CM | POA: Insufficient documentation

## 2024-09-09 DIAGNOSIS — D123 Benign neoplasm of transverse colon: Secondary | ICD-10-CM | POA: Insufficient documentation

## 2024-09-09 HISTORY — PX: COLONOSCOPY: SHX5424

## 2024-09-09 HISTORY — PX: POLYPECTOMY: SHX149

## 2024-09-09 SURGERY — COLONOSCOPY
Anesthesia: General

## 2024-09-09 MED ORDER — LIDOCAINE HCL (PF) 2 % IJ SOLN
INTRAMUSCULAR | Status: AC
  Filled 2024-09-09: qty 5

## 2024-09-09 MED ORDER — GLYCOPYRROLATE 0.2 MG/ML IJ SOLN
INTRAMUSCULAR | Status: AC
  Filled 2024-09-09: qty 1

## 2024-09-09 MED ORDER — LIDOCAINE HCL (CARDIAC) PF 100 MG/5ML IV SOSY
PREFILLED_SYRINGE | INTRAVENOUS | Status: DC | PRN
  Administered 2024-09-09: 60 mg via INTRAVENOUS

## 2024-09-09 MED ORDER — EPHEDRINE SULFATE-NACL 50-0.9 MG/10ML-% IV SOSY
PREFILLED_SYRINGE | INTRAVENOUS | Status: DC | PRN
  Administered 2024-09-09: 15 mg via INTRAVENOUS
  Administered 2024-09-09: 10 mg via INTRAVENOUS

## 2024-09-09 MED ORDER — LIDOCAINE HCL (PF) 2 % IJ SOLN
INTRAMUSCULAR | Status: AC
  Filled 2024-09-09: qty 15

## 2024-09-09 MED ORDER — DEXMEDETOMIDINE HCL IN NACL 80 MCG/20ML IV SOLN
INTRAVENOUS | Status: DC | PRN
  Administered 2024-09-09: 12 ug via INTRAVENOUS
  Administered 2024-09-09: 8 ug via INTRAVENOUS

## 2024-09-09 MED ORDER — EPHEDRINE 5 MG/ML INJ
INTRAVENOUS | Status: AC
  Filled 2024-09-09: qty 5

## 2024-09-09 MED ORDER — SODIUM CHLORIDE 0.9 % IV SOLN
INTRAVENOUS | Status: DC
  Administered 2024-09-09: 20 mL/h via INTRAVENOUS

## 2024-09-09 MED ORDER — PROPOFOL 500 MG/50ML IV EMUL
INTRAVENOUS | Status: DC | PRN
  Administered 2024-09-09: 75 ug/kg/min via INTRAVENOUS

## 2024-09-09 MED ORDER — PROPOFOL 10 MG/ML IV BOLUS
INTRAVENOUS | Status: DC | PRN
  Administered 2024-09-09 (×2): 50 mg via INTRAVENOUS

## 2024-09-09 MED ORDER — PROPOFOL 1000 MG/100ML IV EMUL
INTRAVENOUS | Status: AC
  Filled 2024-09-09: qty 100

## 2024-09-09 NOTE — Anesthesia Postprocedure Evaluation (Signed)
"   Anesthesia Post Note  Patient: Norman Humphrey  Procedure(s) Performed: COLONOSCOPY POLYPECTOMY, INTESTINE  Patient location during evaluation: Endoscopy Anesthesia Type: General Level of consciousness: awake and alert Pain management: pain level controlled Vital Signs Assessment: post-procedure vital signs reviewed and stable Respiratory status: spontaneous breathing, nonlabored ventilation and respiratory function stable Cardiovascular status: blood pressure returned to baseline and stable Postop Assessment: no apparent nausea or vomiting Anesthetic complications: no   No notable events documented.   Last Vitals:  Vitals:   09/09/24 1251 09/09/24 1301  BP: 107/81 117/81  Pulse: 74 71  Resp: 16 13  Temp:    SpO2: 100% 99%    Last Pain:  Vitals:   09/09/24 1301  TempSrc:   PainSc: 0-No pain                 Fairy POUR Maryella Abood      "

## 2024-09-09 NOTE — H&P (Signed)
 "  Pre-Procedure H&P   Patient ID: Norman Humphrey is a 58 y.o. male.  Gastroenterology Provider: Elspeth Ozell Jungling, DO  Referring Provider: Noretta Cave, NP PCP: Cave Noretta, NP  Date: 09/09/2024  HPI Mr. Norman Humphrey is a 58 y.o. male who presents today for Colonoscopy for Colorectal cancer screening .  Initial screening.  No family history of colon cancer or colon polyps.  No current GI symptoms   Past Medical History:  Diagnosis Date   Heart murmur     Past Surgical History:  Procedure Laterality Date   HERNIA REPAIR      Family History No h/o GI disease or malignancy  Review of Systems  Constitutional:  Negative for activity change, appetite change, chills, diaphoresis, fatigue, fever and unexpected weight change.  HENT:  Negative for trouble swallowing and voice change.   Respiratory:  Negative for shortness of breath and wheezing.   Cardiovascular:  Negative for chest pain, palpitations and leg swelling.  Gastrointestinal:  Negative for abdominal distention, abdominal pain, anal bleeding, blood in stool, constipation, diarrhea, nausea and vomiting.  Musculoskeletal:  Negative for arthralgias and myalgias.  Skin:  Negative for color change and pallor.  Neurological:  Negative for dizziness, syncope and weakness.  Psychiatric/Behavioral:  Negative for confusion. The patient is not nervous/anxious.   All other systems reviewed and are negative.    Medications Medications Ordered Prior to Encounter[1]  Pertinent medications related to GI and procedure were reviewed by me with the patient prior to the procedure  Current Medications[2]  sodium chloride 20 mL/hr at 09/09/24 1128       Allergies[3] Allergies were reviewed by me prior to the procedure  Objective   Body mass index is 25.76 kg/m. Vitals:   09/09/24 1051  BP: (!) 149/99  Pulse: 66  Resp: 20  Temp: 98 F (36.7 C)  TempSrc: Temporal  SpO2: 100%  Weight: 76.8 kg  Height:  5' 8 (1.727 m)     Physical Exam Vitals and nursing note reviewed.  Constitutional:      General: He is not in acute distress.    Appearance: Normal appearance. He is not ill-appearing, toxic-appearing or diaphoretic.  HENT:     Head: Normocephalic and atraumatic.     Nose: Nose normal.     Mouth/Throat:     Mouth: Mucous membranes are moist.     Pharynx: Oropharynx is clear.  Eyes:     General: No scleral icterus.    Extraocular Movements: Extraocular movements intact.  Cardiovascular:     Rate and Rhythm: Normal rate and regular rhythm.  Pulmonary:     Effort: Pulmonary effort is normal. No respiratory distress.     Breath sounds: Normal breath sounds. No wheezing, rhonchi or rales.  Abdominal:     General: Bowel sounds are normal. There is no distension.     Palpations: Abdomen is soft.     Tenderness: There is no abdominal tenderness. There is no guarding or rebound.  Musculoskeletal:     Cervical back: Neck supple.     Right lower leg: No edema.     Left lower leg: No edema.  Skin:    General: Skin is warm and dry.     Coloration: Skin is not jaundiced or pale.  Neurological:     General: No focal deficit present.     Mental Status: He is alert and oriented to person, place, and time. Mental status is at baseline.  Psychiatric:  Mood and Affect: Mood normal.        Behavior: Behavior normal.        Thought Content: Thought content normal.        Judgment: Judgment normal.      Assessment:  Mr. Norman Humphrey is a 58 y.o. male  who presents today for Colonoscopy for Colorectal cancer screening .  Plan:  Colonoscopy with possible intervention today  Colonoscopy with possible biopsy, control of bleeding, polypectomy, and interventions as necessary has been discussed with the patient/patient representative. Informed consent was obtained from the patient/patient representative after explaining the indication, nature, and risks of the procedure including  but not limited to death, bleeding, perforation, missed neoplasm/lesions, cardiorespiratory compromise, and reaction to medications. Opportunity for questions was given and appropriate answers were provided. Patient/patient representative has verbalized understanding is amenable to undergoing the procedure.   Elspeth Ozell Jungling, DO  [facility] Gastroenter[facility]  Portions of the record may have been created with voice recognition software. Occasional wrong-word or 'sound-a-like' substitutions may have occurred due to the inherent limitations of voice recognition software.  Read the chart carefully and recognize, using context, where substitutions may have occurred.     [1]  No current facility-administered medications on file prior to encounter.   No current outpatient medications on file prior to encounter.  [2]  Current Facility-Administered Medications:    0.9 %  sodium chloride infusion, , Intravenous, Continuous, Jungling Elspeth Ozell, DO, Last Rate: 20 mL/hr at 09/09/24 1128, Continued from Pre-op at 09/09/24 1128 [3]  Allergies Allergen Reactions   Penicillins Shortness Of Breath and Other (See Comments)    Throat swelling   "

## 2024-09-09 NOTE — Op Note (Signed)
 [facility] Gastroenterology Patient Name: Norman Humphrey Procedure Date: 09/09/2024 11:48 AM MRN: [id number] Account #: [id number] Date of Birth: November 24, 1965 Admit Type: Outpatient Age: 58 Room: [facility] ENDO ROOM 1 Gender: Male Note Status: Finalized Instrument Name: Colon Scope [id number] Procedure:             Colonoscopy Indications:           Screening for colorectal malignant neoplasm Providers:             Elspeth Ozell Onita ROSALEA, DO Referring MD:          Noretta Cave (Referring MD) Medicines:             Monitored Anesthesia Care Complications:         No immediate complications. Estimated blood loss:                         Minimal. Procedure:             Pre-Anesthesia Assessment:                        - Prior to the procedure, a History and Physical was                         performed, and patient medications and allergies were                         reviewed. The patient is competent. The risks and                         benefits of the procedure and the sedation options and                         risks were discussed with the patient. All questions                         were answered and informed consent was obtained.                         Patient identification and proposed procedure were                         verified by the physician, the nurse, the anesthetist                         and the technician in the endoscopy suite. Mental                         Status Examination: alert and oriented. Airway                         Examination: normal oropharyngeal airway and neck                         mobility. Respiratory Examination: clear to                         auscultation. CV Examination: regular rate and rhythm.  Prophylactic Antibiotics: The patient does not require                         prophylactic antibiotics. Prior Anticoagulants: The                         patient has taken no anticoagulant or  antiplatelet                         agents. ASA Grade Assessment: II - A patient with mild                         systemic disease. After reviewing the risks and                         benefits, the patient was deemed in satisfactory                         condition to undergo the procedure. The anesthesia                         plan was to use monitored anesthesia care (MAC).                         Immediately prior to administration of medications,                         the patient was re-assessed for adequacy to receive                         sedatives. The heart rate, respiratory rate, oxygen                         saturations, blood pressure, adequacy of pulmonary                         ventilation, and response to care were monitored                         throughout the procedure. The physical status of the                         patient was re-assessed after the procedure.                        After obtaining informed consent, the colonoscope was                         passed under direct vision. Throughout the procedure,                         the patient's blood pressure, pulse, and oxygen                         saturations were monitored continuously. The                         Colonoscope was introduced through the anus and  advanced to the the terminal ileum, with                         identification of the appendiceal orifice and IC                         valve. The colonoscopy was performed without                         difficulty. The patient tolerated the procedure well.                         The quality of the bowel preparation was evaluated                         using the BBPS [facility] Bowel Preparation Scale) with                         scores of: Right Colon = 2 (minor amount of residual                         staining, small fragments of stool and/or opaque                         liquid, but mucosa seen well),  Transverse Colon = 3                         (entire mucosa seen well with no residual staining,                         small fragments of stool or opaque liquid) and Left                         Colon = 2 (minor amount of residual staining, small                         fragments of stool and/or opaque liquid, but mucosa                         seen well). The total BBPS score equals 7. The quality                         of the bowel preparation was good. The terminal ileum,                         ileocecal valve, appendiceal orifice, and rectum were                         photographed. Findings:      The perianal and digital rectal examinations were normal. Pertinent       negatives include normal sphincter tone.      The terminal ileum appeared normal. Estimated blood loss: none.      Retroflexion in the right colon was performed.      Two sessile polyps were found in the transverse colon and ascending       colon. The polyps were 1 to 2 mm in size. These  polyps were removed with       a jumbo cold forceps. Resection and retrieval were complete. Estimated       blood loss was minimal.      Two sessile polyps were found in the ascending colon. The polyps were 3       to 7 mm in size. These polyps were removed with a cold snare. Resection       and retrieval were complete. Estimated blood loss was minimal.      The exam was otherwise without abnormality on direct and retroflexion       views. Impression:            - The examined portion of the ileum was normal.                        - Two 1 to 2 mm polyps in the transverse colon and in                         the ascending colon, removed with a jumbo cold                         forceps. Resected and retrieved.                        - Two 3 to 7 mm polyps in the ascending colon, removed                         with a cold snare. Resected and retrieved.                        - The examination was otherwise normal on direct and                          retroflexion views. Recommendation:        - Patient has a contact number available for                         emergencies. The signs and symptoms of potential                         delayed complications were discussed with the patient.                         Return to normal activities tomorrow. Written                         discharge instructions were provided to the patient.                        - Discharge patient to home.                        - Resume previous diet.                        - Continue present medications.                        - No ibuprofen, naproxen, or other non-steroidal  anti-inflammatory drugs for 5 days after polyp removal.                        - Await pathology results.                        - Repeat colonoscopy for surveillance based on                         pathology results.                        - Return to referring physician as previously                         scheduled.                        - The findings and recommendations were discussed with                         the patient. Procedure Code(s):     --- Professional ---                        [phone], Colonoscopy, flexible; with removal of                         tumor(s), polyp(s), or other lesion(s) by snare                         technique                        [id number], 59, Colonoscopy, flexible; with biopsy, single                         or multiple Diagnosis Code(s):     --- Professional ---                        Z12.11, Encounter for screening for malignant neoplasm                         of colon                        D12.3, Benign neoplasm of transverse colon (hepatic                         flexure or splenic flexure)                        D12.2, Benign neoplasm of ascending colon CPT copyright 2022 American [employer] Association. All rights reserved. The codes documented in this report are preliminary and upon coder  review may  be revised to meet current compliance requirements. Attending Participation:      I personally performed the entire procedure. Elspeth Jungling, DO Elspeth Ozell Jungling DO, DO 09/09/2024 12:34:38 PM This report has been signed electronically. Number of Addenda: 0 Note Initiated On: 09/09/2024 11:48 AM Scope Withdrawal Time: 0 hours 16 minutes 33 seconds  Total Procedure Duration: 0 hours 19 minutes 49 seconds  Estimated Blood Loss:  Estimated blood loss was minimal.      [facility]

## 2024-09-09 NOTE — Transfer of Care (Signed)
 Immediate Anesthesia Transfer of Care Note  Patient: Norman Humphrey  Procedure(s) Performed: COLONOSCOPY POLYPECTOMY, INTESTINE  Patient Location: PACU  Anesthesia Type:General  Level of Consciousness: sedated  Airway & Oxygen Therapy: Patient Spontanous Breathing  Post-op Assessment: Report given to RN and Post -op Vital signs reviewed and stable  Post vital signs: Reviewed and stable  Last Vitals:  Vitals Value Taken Time  BP    Temp    Pulse    Resp    SpO2      Last Pain:  Vitals:   09/09/24 1051  TempSrc: Temporal  PainSc: 0-No pain         Complications: No notable events documented.

## 2024-09-09 NOTE — Anesthesia Preprocedure Evaluation (Addendum)
"                                    Anesthesia Evaluation  Patient identified by MRN, date of birth, ID band Patient awake    Reviewed: Allergy & Precautions, NPO status , Patient's Chart, lab work & pertinent test results  History of Anesthesia Complications Negative for: history of anesthetic complications  Airway Mallampati: III  TM Distance: >3 FB Neck ROM: full    Dental  (+) Missing   Pulmonary former smoker   Pulmonary exam normal        Cardiovascular Normal cardiovascular exam+ Valvular Problems/Murmurs      Neuro/Psych negative neurological ROS  negative psych ROS   GI/Hepatic negative GI ROS,neg GERD  ,,(+) Hepatitis -, C  Endo/Other  negative endocrine ROS    Renal/GU negative Renal ROS  negative genitourinary   Musculoskeletal   Abdominal   Peds  Hematology negative hematology ROS (+)   Anesthesia Other Findings Past Medical History: No date: Heart murmur  Past Surgical History: No date: HERNIA REPAIR  BMI    Body Mass Index: 25.76 kg/m      Reproductive/Obstetrics negative OB ROS                              Anesthesia Physical Anesthesia Plan  ASA: 3  Anesthesia Plan: General   Post-op Pain Management:    Induction: Intravenous  PONV Risk Score and Plan: Propofol infusion and TIVA  Airway Management Planned: Natural Airway and Nasal Cannula  Additional Equipment:   Intra-op Plan:   Post-operative Plan:   Informed Consent: I have reviewed the patients History and Physical, chart, labs and discussed the procedure including the risks, benefits and alternatives for the proposed anesthesia with the patient or authorized representative who has indicated his/her understanding and acceptance.     Dental Advisory Given  Plan Discussed with: Anesthesiologist, CRNA and Surgeon  Anesthesia Plan Comments: (Patient consented for risks of anesthesia including but not limited to:  - adverse  reactions to medications - risk of airway placement if required - damage to eyes, teeth, lips or other oral mucosa - nerve damage due to positioning  - sore throat or hoarseness - Damage to heart, brain, nerves, lungs, other parts of body or loss of life  Patient voiced understanding and assent.)         Anesthesia Quick Evaluation  "

## 2024-09-09 NOTE — Interval H&P Note (Signed)
 History and Physical Interval Note: Preprocedure H&P from 09/09/2024  was reviewed and there was no interval change after seeing and examining the patient.  Written consent was obtained from the patient after discussion of risks, benefits, and alternatives. Patient has consented to proceed with Colonoscopy with possible intervention   09/09/2024 12:02 PM  Norman Humphrey  has presented today for surgery, with the diagnosis of Colon cancer screening (Z12.11).  The various methods of treatment have been discussed with the patient and family. After consideration of risks, benefits and other options for treatment, the patient has consented to  Procedures: COLONOSCOPY (N/A) as a surgical intervention.  The patient's history has been reviewed, patient examined, no change in status, stable for surgery.  I have reviewed the patient's chart and labs.  Questions were answered to the patient's satisfaction.     Elspeth Ozell Jungling

## 2024-09-12 LAB — SURGICAL PATHOLOGY

## 2024-09-29 ENCOUNTER — Encounter (HOSPITAL_BASED_OUTPATIENT_CLINIC_OR_DEPARTMENT_OTHER): Admitting: Family Medicine

## 2024-11-30 ENCOUNTER — Encounter (HOSPITAL_BASED_OUTPATIENT_CLINIC_OR_DEPARTMENT_OTHER): Admitting: Family Medicine
